# Patient Record
Sex: Female | Born: 1960 | Race: White | Hispanic: No | Marital: Married | Smoking: Never smoker
Health system: Southern US, Community
[De-identification: ages and names within clinical notes are randomized; demographics above are authoritative.]

## PROBLEM LIST (undated history)

## (undated) DIAGNOSIS — F329 Major depressive disorder, single episode, unspecified: Secondary | ICD-10-CM

## (undated) DIAGNOSIS — E079 Disorder of thyroid, unspecified: Secondary | ICD-10-CM

## (undated) DIAGNOSIS — T7840XA Allergy, unspecified, initial encounter: Secondary | ICD-10-CM

## (undated) DIAGNOSIS — F32A Depression, unspecified: Secondary | ICD-10-CM

## (undated) HISTORY — PX: ABDOMINAL HYSTERECTOMY: SHX81

## (undated) HISTORY — PX: LUNG SURGERY: SHX703

## (undated) HISTORY — PX: THYROID SURGERY: SHX805

## (undated) HISTORY — DX: Allergy, unspecified, initial encounter: T78.40XA

---

## 2013-09-21 ENCOUNTER — Emergency Department (HOSPITAL_BASED_OUTPATIENT_CLINIC_OR_DEPARTMENT_OTHER)
Admission: EM | Admit: 2013-09-21 | Discharge: 2013-09-21 | Disposition: A | Discharge status: Home / Self Care | Payer: Worker's Compensation | Attending: Emergency Medicine | Admitting: Emergency Medicine

## 2013-09-21 ENCOUNTER — Encounter (HOSPITAL_BASED_OUTPATIENT_CLINIC_OR_DEPARTMENT_OTHER): Payer: Self-pay | Admitting: Emergency Medicine

## 2013-09-21 ENCOUNTER — Emergency Department (HOSPITAL_BASED_OUTPATIENT_CLINIC_OR_DEPARTMENT_OTHER): Payer: Worker's Compensation

## 2013-09-21 DIAGNOSIS — W010XXA Fall on same level from slipping, tripping and stumbling without subsequent striking against object, initial encounter: Secondary | ICD-10-CM | POA: Insufficient documentation

## 2013-09-21 DIAGNOSIS — S93609A Unspecified sprain of unspecified foot, initial encounter: Secondary | ICD-10-CM

## 2013-09-21 DIAGNOSIS — Y9389 Activity, other specified: Secondary | ICD-10-CM | POA: Insufficient documentation

## 2013-09-21 DIAGNOSIS — Y929 Unspecified place or not applicable: Secondary | ICD-10-CM | POA: Insufficient documentation

## 2013-09-21 DIAGNOSIS — Z79899 Other long term (current) drug therapy: Secondary | ICD-10-CM | POA: Insufficient documentation

## 2013-09-21 DIAGNOSIS — E079 Disorder of thyroid, unspecified: Secondary | ICD-10-CM | POA: Insufficient documentation

## 2013-09-21 DIAGNOSIS — Z8659 Personal history of other mental and behavioral disorders: Secondary | ICD-10-CM | POA: Insufficient documentation

## 2013-09-21 DIAGNOSIS — IMO0002 Reserved for concepts with insufficient information to code with codable children: Secondary | ICD-10-CM | POA: Insufficient documentation

## 2013-09-21 DIAGNOSIS — Z791 Long term (current) use of non-steroidal anti-inflammatories (NSAID): Secondary | ICD-10-CM | POA: Insufficient documentation

## 2013-09-21 HISTORY — DX: Major depressive disorder, single episode, unspecified: F32.9

## 2013-09-21 HISTORY — DX: Depression, unspecified: F32.A

## 2013-09-21 HISTORY — DX: Disorder of thyroid, unspecified: E07.9

## 2013-09-21 MED ORDER — HYDROCODONE-ACETAMINOPHEN 5-325 MG PO TABS: 2.0000 | ORAL_TABLET | ORAL | Status: DC | PRN

## 2013-09-21 MED ORDER — MORPHINE SULFATE 4 MG/ML IJ SOLN
4.0000 mg | Freq: Once | INTRAMUSCULAR | Status: AC
  Administered 2013-09-21: 4 mg via INTRAVENOUS
  Filled 2013-09-21: qty 1

## 2013-09-21 MED ORDER — ONDANSETRON HCL 4 MG/2ML IJ SOLN
4.0000 mg | Freq: Once | INTRAMUSCULAR | Status: AC
  Administered 2013-09-21: 4 mg via INTRAVENOUS
  Filled 2013-09-21: qty 2

## 2013-09-21 NOTE — ED Notes (Signed)
Patient transported to x-ray. ?

## 2013-09-21 NOTE — Discharge Instructions (Signed)
Foot Sprain The muscles and cord like structures which attach muscle to bone (tendons) that surround the feet are made up of units. A foot sprain can occur at the weakest spot in any of these units. This condition is most often caused by injury to or overuse of the foot, as from playing contact sports, or aggravating a previous injury, or from poor conditioning, or obesity. SYMPTOMS  Pain with movement of the foot.  Tenderness and swelling at the injury site.  Loss of strength is present in moderate or severe sprains. THE THREE GRADES OR SEVERITY OF FOOT SPRAIN ARE:  Mild (Grade I): Slightly pulled muscle without tearing of muscle or tendon fibers or loss of strength.  Moderate (Grade II): Tearing of fibers in a muscle, tendon, or at the attachment to bone, with small decrease in strength.  Severe (Grade III): Rupture of the muscle-tendon-bone attachment, with separation of fibers. Severe sprain requires surgical repair. Often repeating (chronic) sprains are caused by overuse. Sudden (acute) sprains are caused by direct injury or over-use. DIAGNOSIS  Diagnosis of this condition is usually by your own observation. If problems continue, a caregiver may be required for further evaluation and treatment. X-rays may be required to make sure there are not breaks in the bones (fractures) present. Continued problems may require physical therapy for treatment. PREVENTION  Use strength and conditioning exercises appropriate for your sport.  Warm up properly prior to working out.  Use athletic shoes that are made for the sport you are participating in.  Allow adequate time for healing. Early return to activities makes repeat injury more likely, and can lead to an unstable arthritic foot that can result in prolonged disability. Mild sprains generally heal in 3 to 10 days, with moderate and severe sprains taking 2 to 10 weeks. Your caregiver can help you determine the proper time required for  healing. HOME CARE INSTRUCTIONS   Apply ice to the injury for 15-20 minutes, 03-04 times per day. Put the ice in a plastic bag and place a towel between the bag of ice and your skin.  An elastic wrap (like an Ace bandage) may be used to keep swelling down.  Keep foot above the level of the heart, or at least raised on a footstool, when swelling and pain are present.  Try to avoid use other than gentle range of motion while the foot is painful. Do not resume use until instructed by your caregiver. Then begin use gradually, not increasing use to the point of pain. If pain does develop, decrease use and continue the above measures, gradually increasing activities that do not cause discomfort, until you gradually achieve normal use.  Use crutches if and as instructed, and for the length of time instructed.  Keep injured foot and ankle wrapped between treatments.  Massage foot and ankle for comfort and to keep swelling down. Massage from the toes up towards the knee.  Only take over-the-counter or prescription medicines for pain, discomfort, or fever as directed by your caregiver. SEEK IMMEDIATE MEDICAL CARE IF:   Your pain and swelling increase, or pain is not controlled with medications.  You have loss of feeling in your foot or your foot turns cold or blue.  You develop new, unexplained symptoms, or an increase of the symptoms that brought you to your caregiver. MAKE SURE YOU:   Understand these instructions.  Will watch your condition.  Will get help right away if you are not doing well or get worse. Document Released:   09/13/2001 Document Revised: 06/16/2011 Document Reviewed: 11/11/2007 Exit[employer] Patient Information 2015 [employer]. This information is not intended to replace advice given to you by your health care provider. Make sure you discuss any questions you have with your health care provider.  

## 2013-09-21 NOTE — ED Provider Notes (Signed)
CSN: [id number]     Arrival date & time 09/21/13  1025 History   None    Chief Complaint  Patient presents with  . Ankle Injury     (Consider location/radiation/quality/duration/timing/severity/associated sxs/prior Treatment) HPI Comments: Patient presents to the ER for evaluation after a fall. Patient reports that she tripped over a carpet and fell to the ground. She reports that her knee and foot bent up under her when she fell. She is complaining of pain in the foot and ankle region, left knee as well as lower back, more on left side and midline. She did not hit her head or lose consciousness. No upper extremity injury. The pain is moderate to severe, constant and worsens with movement.  Patient is a 53 y.o. female presenting with lower extremity injury.  Ankle Injury Pertinent negatives include no headaches.    Past Medical History  Diagnosis Date  . Thyroid disease   . Depression    Past Surgical History  Procedure Laterality Date  . Lung surgery     No family history on file. History  Substance Use Topics  . Smoking status: Not on file  . Smokeless tobacco: Not on file  . Alcohol Use: Not on file   OB History   Grav Para Term Preterm Abortions TAB SAB Ect Mult Living                 Review of Systems  Musculoskeletal: Positive for arthralgias and back pain.  Neurological: Negative for dizziness, syncope and headaches.  All other systems reviewed and are negative.     Allergies  Tramadol  Home Medications   Prior to Admission medications   Medication Sig Start Date End Date Taking? Authorizing Provider  diclofenac (CATAFLAM) 50 MG tablet Take 50 mg by mouth 3 (three) times daily.   Yes Historical Provider, MD  fluticasone (VERAMYST) 27.5 MCG/SPRAY nasal spray Place 2 sprays into the nose daily.   Yes Historical Provider, MD  levothyroxine (SYNTHROID, LEVOTHROID) 100 MCG tablet Take 100 mcg by mouth daily before breakfast.   Yes Historical Provider, MD    omeprazole (PRILOSEC) 40 MG capsule Take 40 mg by mouth daily.   Yes Historical Provider, MD  phentermine 37.5 MG capsule Take 37.5 mg by mouth every morning.   Yes Historical Provider, MD   BP 127/77  Pulse 61  Temp(Src) 98.1 F (36.7 C) (Oral)  Resp 16  Ht 5\' 5"  (1.651 m)  Wt 180 lb (81.647 kg)  BMI 29.95 kg/m2  SpO2 99% Physical Exam  Constitutional: She is oriented to person, place, and time. She appears well-developed and well-nourished. No distress.  HENT:  Head: Normocephalic and atraumatic.  Right Ear: Hearing normal.  Left Ear: Hearing normal.  Nose: Nose normal.  Mouth/Throat: Oropharynx is clear and moist and mucous membranes are normal.  Eyes: Conjunctivae and EOM are normal. Pupils are equal, round, and reactive to light.  Neck: Normal range of motion. Neck supple. No spinous process tenderness and no muscular tenderness present.  Cardiovascular: Regular rhythm, S1 normal and S2 normal.  Exam reveals no gallop and no friction rub.   No murmur heard. Pulmonary/Chest: Effort normal and breath sounds normal. No respiratory distress. She exhibits no tenderness.  Abdominal: Soft. Normal appearance and bowel sounds are normal. There is no hepatosplenomegaly. There is no tenderness. There is no rebound, no guarding, no tenderness at McBurney's point and negative Murphy's sign. No hernia.  Musculoskeletal: Normal range of motion.  Left knee: She exhibits normal range of motion, no swelling, no effusion, no ecchymosis and no deformity. Tenderness found.       Left ankle: No lateral malleolus and no medial malleolus tenderness found.       Thoracic back: Normal.       Lumbar back: She exhibits tenderness. She exhibits no bony tenderness.       Back:       Left foot: She exhibits bony tenderness.       Feet:  Neurological: She is alert and oriented to person, place, and time. She has normal strength. No cranial nerve deficit or sensory deficit. Coordination normal. GCS  eye subscore is 4. GCS verbal subscore is 5. GCS motor subscore is 6.  Skin: Skin is warm, dry and intact. No rash noted. No cyanosis.  Psychiatric: She has a normal mood and affect. Her speech is normal and behavior is normal. Thought content normal.    ED Course  Procedures (including critical care time) Labs Review Labs Reviewed - No data to display  Imaging Review Dg Lumbar Spine Complete  09/21/2013   CLINICAL DATA:  Fall, back pain  EXAM: LUMBAR SPINE - COMPLETE 4+ VIEW  COMPARISON:  None.  FINDINGS: There is levoscoliosis of the lumbar spine. No acute loss vertebral body height or disc height. Endplate spurring is noted multiple levels. No subluxation.  IMPRESSION: 1. No acute findings lumbar spine. 2. Mild disc osteophytic disease and scoliosis.   Electronically Signed   By: Genevive BiStewart  Edmunds M.D.   On: 09/21/2013 11:28   Dg Ankle Complete Left  09/21/2013   CLINICAL DATA:  Tripped over carpet.  Left lower extremity pain.  EXAM: LEFT ANKLE COMPLETE - 3+ VIEW  COMPARISON:  None.  FINDINGS: There is no evidence of fracture, dislocation, or joint effusion. Small plantar calcaneal spur. Mild soft tissue swelling dorsal to the talar head.  IMPRESSION: 1. Mild dorsal soft tissue swelling along the ankle. No fracture identified. 2. Small plantar calcaneal spur.   Electronically Signed   By: Herbie BaltimoreWalt  Liebkemann M.D.   On: 09/21/2013 11:25   Dg Knee Complete 4 Views Left  09/21/2013   CLINICAL DATA:  Tripped over carpet.  Knee pain.  EXAM: LEFT KNEE - COMPLETE 4+ VIEW  COMPARISON:  None.  FINDINGS: Mild spurring of the tibial spine. Mild patellar spurring. No fracture or knee effusion. No acute bony findings.  IMPRESSION: 1. No acute bony findings or knee effusion. 2. Mild spurring of the patella and tibial spine.   Electronically Signed   By: Herbie BaltimoreWalt  Liebkemann M.D.   On: 09/21/2013 11:28   Dg Foot Complete Left  09/21/2013   CLINICAL DATA:  Pt fell over a piece of carpet today, pain lt foot, ankle  and knee / pain, low back with rt hip pain  EXAM: LEFT FOOT - COMPLETE 3+ VIEW  COMPARISON:  None.  FINDINGS: No fracture or dislocation of mid foot or forefoot. The phalanges are normal. The calcaneus is normal. There is soft tissue swelling over the talus anteriorly.  IMPRESSION: 1. No evidence of ankle fracture. 2. Soft tissue swelling over the anterior talus.   Electronically Signed   By: Genevive BiStewart  Edmunds M.D.   On: 09/21/2013 11:26     EKG Interpretation None      MDM   Final diagnoses:  None   ankle/foot sprain  Patient presents to the ER after injury of the left foot and ankle region. Patient tripped and fell with the  foot hyperextended and the knee flexed under her. She is complaining of pain in these areas as well as low back. Back examination was more consistent with soft tissue injury, off to the left side. Lumbar x-ray was negative. X-ray of the knee was negative, no concern for ligamentous instability at this time based on exam. She did have swelling and tenderness over the anterior talus region. Ankle, however, was not swollen. X-ray of foot and ankle were performed and is noted. Patient does have moderate swelling and pain and tenderness in the anterior dorsal foot region. Will be placed in a walking splint, followup with orthopedics.    Gilda Creasehristopher J. Shaquel Chavous, MD 09/21/13 1147

## 2013-09-21 NOTE — ED Notes (Signed)
Left ankle pain and deformity after tripping and falling on carpet.

## 2013-09-22 ENCOUNTER — Ambulatory Visit (INDEPENDENT_AMBULATORY_CARE_PROVIDER_SITE_OTHER): Payer: Worker's Compensation | Admitting: Family Medicine

## 2013-09-22 ENCOUNTER — Encounter: Payer: Self-pay | Admitting: Family Medicine

## 2013-09-22 ENCOUNTER — Ambulatory Visit: Payer: Worker's Compensation | Admitting: Family Medicine

## 2013-09-22 VITALS — BP 118/75 | HR 75 | Ht 65.0 in | Wt 180.0 lb

## 2013-09-22 DIAGNOSIS — S8000XA Contusion of unspecified knee, initial encounter: Secondary | ICD-10-CM

## 2013-09-22 DIAGNOSIS — S39012A Strain of muscle, fascia and tendon of lower back, initial encounter: Secondary | ICD-10-CM

## 2013-09-22 DIAGNOSIS — S99929A Unspecified injury of unspecified foot, initial encounter: Secondary | ICD-10-CM

## 2013-09-22 DIAGNOSIS — S8990XA Unspecified injury of unspecified lower leg, initial encounter: Secondary | ICD-10-CM

## 2013-09-22 DIAGNOSIS — S99919A Unspecified injury of unspecified ankle, initial encounter: Secondary | ICD-10-CM

## 2013-09-22 DIAGNOSIS — S335XXA Sprain of ligaments of lumbar spine, initial encounter: Secondary | ICD-10-CM

## 2013-09-22 DIAGNOSIS — S99912A Unspecified injury of left ankle, initial encounter: Secondary | ICD-10-CM

## 2013-09-22 MED ORDER — HYDROCODONE-ACETAMINOPHEN 5-325 MG PO TABS: 1.0000 | ORAL_TABLET | Freq: Four times a day (QID) | ORAL | Status: DC | PRN

## 2013-09-22 NOTE — Patient Instructions (Signed)
You have an ankle sprain. Ice the area for 15 minutes at a time, 3-4 times a day Aleve 2 tabs twice a day with food OR ibuprofen 3 tabs three times a day with food for pain and inflammation. Norco as needed for severe pain. Elevate above the level of your heart when possible Crutches if needed to help with walking Bear weight when tolerated Use boot to help with stability while you recover from this injury for next 2 weeks. Come out of the boot/brace twice a day to do Up/down and alphabet exercises 2-3 sets of each. Consider physical therapy for strengthening and balance exercises in the future.  You have a right knee contusion and a lumbar strain as well from the fall. We will reassess these in 2 weeks. Medication as noted above. Can try some home back exercises in the handout in the meantime. Will consider physical therapy for this depending on how you're doing. Follow up with me in 2 weeks.

## 2013-09-26 ENCOUNTER — Encounter: Payer: Self-pay | Admitting: Family Medicine

## 2013-09-26 DIAGNOSIS — S99912A Unspecified injury of left ankle, initial encounter: Secondary | ICD-10-CM | POA: Insufficient documentation

## 2013-09-26 DIAGNOSIS — S8000XA Contusion of unspecified knee, initial encounter: Secondary | ICD-10-CM | POA: Insufficient documentation

## 2013-09-26 DIAGNOSIS — M545 Low back pain, unspecified: Secondary | ICD-10-CM | POA: Insufficient documentation

## 2013-09-26 NOTE — Assessment & Plan Note (Signed)
radiographs negative, exam benign.  Reassured.

## 2013-09-26 NOTE — Assessment & Plan Note (Signed)
radiographs negative.  2/2 strain.  NSAIDs with norco as needed.  Consider PT in future.

## 2013-09-26 NOTE — Progress Notes (Signed)
Patient ID: Toniann KetJudy Vazquez, female   DOB: [birth year], 53 y.o.   MRN: [id number]  PCP: Joneen Roachhomas, GILLIAN W, NP  Subjective:   HPI: Patient is a 53 y.o. female here for left ankle injury.  Patient reports on 6/17 she was at work when she slipped on a piece of carpet padding, left ankle seemed to hyperdorsiflex and she fell backwards onto her buttocks. Went to ED - had x-rays negative for a fracture. Pain is lateral, radiates up outside of leg. Placed in a boot and taking hydrocodone as needed for pain. No prior ankle injuries.  Past Medical History  Diagnosis Date  . Thyroid disease   . Depression   . Allergy     Current Outpatient Prescriptions on File Prior to Visit  Medication Sig Dispense Refill  . fluticasone (VERAMYST) 27.5 MCG/SPRAY nasal spray Place 2 sprays into the nose daily.      Marland Kitchen. levothyroxine (SYNTHROID, LEVOTHROID) 100 MCG tablet Take 100 mcg by mouth daily before breakfast.      . omeprazole (PRILOSEC) 40 MG capsule Take 40 mg by mouth daily.      . phentermine 37.5 MG capsule Take 37.5 mg by mouth every morning.       No current facility-administered medications on file prior to visit.    Past Surgical History  Procedure Laterality Date  . Lung surgery    . Thyroid surgery    . Abdominal hysterectomy      Allergies  Allergen Reactions  . Tramadol     History   Social History  . Marital Status: Married    Spouse Name: N/A    Number of Children: N/A  . Years of Education: N/A   Occupational History  . Not on file.   Social History Main Topics  . Smoking status: Never Smoker   . Smokeless tobacco: Not on file  . Alcohol Use: Not on file  . Drug Use: Not on file  . Sexual Activity: Not on file   Other Topics Concern  . Not on file   Social History Narrative  . No narrative on file    No family history on file.  BP 118/75  Pulse 75  Ht 5\' 5"  (1.651 m)  Wt 180 lb (81.647 kg)  BMI 29.95 kg/m2  Review of Systems: See HPI above.     Objective:  Physical Exam:  Gen: NAD  Left ankle: Mod swelling, bruising lateral ankle.  No other deformity. Mod limitation ROM all directions. TTP Greatest ATFL, less lateral malleolus.  No other bony tenderness of ankle/foot. 2+ ant drawer and talar tilt.  Painful tilt. Negative syndesmotic compression. Thompsons test negative. NV intact distally.    Left knee: No gross deformity, ecchymoses, swelling. TTP mildly over patella, joint lines.  No other TTP. FROM. Negative ant/post drawers. Negative valgus/varus testing. Negative lachmanns. Negative mcmurrays, apleys, patellar apprehension. NV intact distally.  Back: No gross deformity, scoliosis. TTP bilateral lumbar paraspinal regions.  No midline or bony TTP. FROM. Strength LEs 5/5 all muscle groups.   2+ MSRs in patellar and achilles tendons, equal bilaterally. Negative SLRs. Sensation intact to light touch bilaterally. Negative logroll bilateral hips  Assessment & Plan:  1. Left ankle injury - 2/2 sprain.  Shown HEP to do daily.  NSAIDs with norco as needed.  Cam walker for 2 weeks total - f/u at that time.  Hopefully switch to ASO, start physical therapy or home strengthening exercises.  Elevation, icing.    2. Low back injury -  radiographs negative.  2/2 strain.  NSAIDs with norco as needed.  Consider PT in future.  3. Knee contusion - radiographs negative, exam benign.  Reassured.

## 2013-09-26 NOTE — Assessment & Plan Note (Signed)
2/2 sprain.  Shown HEP to do daily.  NSAIDs with norco as needed.  Cam walker for 2 weeks total - f/u at that time.  Hopefully switch to ASO, start physical therapy or home strengthening exercises.  Elevation, icing.

## 2013-10-06 ENCOUNTER — Encounter: Payer: Self-pay | Admitting: Family Medicine

## 2013-10-06 ENCOUNTER — Ambulatory Visit (INDEPENDENT_AMBULATORY_CARE_PROVIDER_SITE_OTHER): Payer: Worker's Compensation | Admitting: Family Medicine

## 2013-10-06 VITALS — BP 126/85 | HR 78 | Ht 65.0 in | Wt 175.0 lb

## 2013-10-06 DIAGNOSIS — S99919A Unspecified injury of unspecified ankle, initial encounter: Secondary | ICD-10-CM

## 2013-10-06 DIAGNOSIS — Z5189 Encounter for other specified aftercare: Secondary | ICD-10-CM

## 2013-10-06 DIAGNOSIS — S99912D Unspecified injury of left ankle, subsequent encounter: Secondary | ICD-10-CM

## 2013-10-06 DIAGNOSIS — S8990XA Unspecified injury of unspecified lower leg, initial encounter: Secondary | ICD-10-CM

## 2013-10-06 DIAGNOSIS — S99929A Unspecified injury of unspecified foot, initial encounter: Secondary | ICD-10-CM

## 2013-10-06 NOTE — Patient Instructions (Signed)
You have an ankle sprain. Start physical therapy - we will arrange this through workers comp. Ice the area for 15 minutes at a time, 3-4 times a day Aleve 2 tabs twice a day with food OR ibuprofen 3 tabs three times a day with food for pain and inflammation. Norco as needed for severe pain. Elevate above the level of your heart when possible Use ankle brace or boot when walking long distances. Use ace wrap for compression under boot. Come out of the boot/brace twice a day to do Up/down and alphabet exercises 2-3 sets of each. Out of work for 4 weeks then follow-up at that time.

## 2013-10-10 ENCOUNTER — Encounter: Payer: Self-pay | Admitting: Family Medicine

## 2013-10-10 NOTE — Progress Notes (Signed)
Patient ID: Mary Meadows, female   DOB: 01/06/1961, 53 y.o.   MRN: [id number]  PCP: Joneen Roachhomas, GILLIAN W, NP  Subjective:   HPI: Patient is a 53 y.o. female here for left ankle injury.  6/18: Patient reports on 6/17 she was at work when she slipped on a piece of carpet padding, left ankle seemed to hyperdorsiflex and she fell backwards onto her buttocks. Went to ED - had x-rays negative for a fracture. Pain is lateral, radiates up outside of leg. Placed in a boot and taking hydrocodone as needed for pain. No prior ankle injuries.  7/2: Patient reports she is starting to improve.   Aching at times. Icing, takes norco as needed. [facility]. Using boot, doing HEP.  Past Medical History  Diagnosis Date  . Thyroid disease   . Depression   . Allergy     Current Outpatient Prescriptions on File Prior to Visit  Medication Sig Dispense Refill  . diclofenac (VOLTAREN) 75 MG EC tablet       . fluticasone (VERAMYST) 27.5 MCG/SPRAY nasal spray Place 2 sprays into the nose daily.      Marland Kitchen. gabapentin (NEURONTIN) 300 MG capsule       . HYDROcodone-acetaminophen (NORCO/VICODIN) 5-325 MG per tablet Take 1 tablet by mouth every 6 (six) hours as needed for moderate pain.  60 tablet  0  . levothyroxine (SYNTHROID, LEVOTHROID) 100 MCG tablet Take 100 mcg by mouth daily before breakfast.      . omeprazole (PRILOSEC) 40 MG capsule Take 40 mg by mouth daily.      . phentermine 37.5 MG capsule Take 37.5 mg by mouth every morning.       No current facility-administered medications on file prior to visit.    Past Surgical History  Procedure Laterality Date  . Lung surgery    . Thyroid surgery    . Abdominal hysterectomy      Allergies  Allergen Reactions  . Tramadol     History   Social History  . Marital Status: Married    Spouse Name: N/A    Number of Children: N/A  . Years of Education: N/A   Occupational History  . Not on file.   Social History Main Topics  .  Smoking status: Never Smoker   . Smokeless tobacco: Not on file  . Alcohol Use: Not on file  . Drug Use: Not on file  . Sexual Activity: Not on file   Other Topics Concern  . Not on file   Social History Narrative  . No narrative on file    No family history on file.  BP 126/85  Pulse 78  Ht 5\' 5"  (1.651 m)  Wt 175 lb (79.379 kg)  BMI 29.12 kg/m2  Review of Systems: See HPI above.    Objective:  Physical Exam:  Gen: NAD  Left ankle: Mild swelling, minimal bruising lateral ankle.  No other deformity.  Hematoma about 1.5cm in diameter dorsal foot. Mild limitation ROM all directions. TTP greatest ATFL, less lateral malleolus.  No other bony tenderness of ankle/foot. 1+ ant drawer and talar tilt.  Painful tilt. Negative syndesmotic compression. Thompsons test negative. NV intact distally.    Assessment & Plan:  1. Left ankle injury - 2/2 sprain.  Improving.  Start physical therapy.  NSAIDs with norco as needed.  Cam walker only if needed - try to switch to ASO if tolerated.  for 2 weeks total - f/u at that time.  Hopefully switch to ASO, start physical therapy or home strengthening exercises.  Elevation, icing.  F/u in 4 weeks.  Continue out of work.

## 2013-10-10 NOTE — Assessment & Plan Note (Signed)
2/2 sprain.  Improving.  Start physical therapy.  NSAIDs with norco as needed.  Cam walker only if needed - try to switch to ASO if tolerated.  for 2 weeks total - f/u at that time.  Hopefully switch to ASO, start physical therapy or home strengthening exercises.  Elevation, icing.  F/u in 4 weeks.  Continue out of work.

## 2013-10-17 ENCOUNTER — Other Ambulatory Visit: Payer: Self-pay | Admitting: *Deleted

## 2013-10-17 DIAGNOSIS — S99912A Unspecified injury of left ankle, initial encounter: Secondary | ICD-10-CM

## 2013-11-03 ENCOUNTER — Ambulatory Visit (INDEPENDENT_AMBULATORY_CARE_PROVIDER_SITE_OTHER): Payer: Worker's Compensation | Admitting: Family Medicine

## 2013-11-03 ENCOUNTER — Encounter: Payer: Self-pay | Admitting: Family Medicine

## 2013-11-03 VITALS — BP 116/73 | HR 87 | Ht 65.0 in | Wt 175.0 lb

## 2013-11-03 DIAGNOSIS — S99912D Unspecified injury of left ankle, subsequent encounter: Secondary | ICD-10-CM

## 2013-11-03 DIAGNOSIS — S99919A Unspecified injury of unspecified ankle, initial encounter: Secondary | ICD-10-CM

## 2013-11-03 DIAGNOSIS — S8990XA Unspecified injury of unspecified lower leg, initial encounter: Secondary | ICD-10-CM

## 2013-11-03 DIAGNOSIS — Z5189 Encounter for other specified aftercare: Secondary | ICD-10-CM

## 2013-11-03 DIAGNOSIS — S99929A Unspecified injury of unspecified foot, initial encounter: Secondary | ICD-10-CM

## 2013-11-07 ENCOUNTER — Encounter: Payer: Self-pay | Admitting: Family Medicine

## 2013-11-07 NOTE — Progress Notes (Signed)
Patient ID: Mary Meadows, female   DOB: 10-Dec-1960, 53 y.o.   MRN: [id number]  PCP: Joneen Roachhomas, GILLIAN W, NP  Subjective:   HPI: Patient is a 53 y.o. female here for left ankle injury.  6/18: Patient reports on 6/17 she was at work when she slipped on a piece of carpet padding, left ankle seemed to hyperdorsiflex and she fell backwards onto her buttocks. Went to ED - had x-rays negative for a fracture. Pain is lateral, radiates up outside of leg. Placed in a boot and taking hydrocodone as needed for pain. No prior ankle injuries.  7/2: Patient reports she is starting to improve.   Aching at times. Icing, takes norco as needed. [facility] if needed too. Using boot, doing HEP.  7/30: Patient reports she is about 50% better from last visit. Only started PT this week - first visit. Hematoma of foot is shrinking. Using ASO. Walking better. Taking pain medicine at night, ibuprofen during day.  Past Medical History  Diagnosis Date  . Thyroid disease   . Depression   . Allergy     Current Outpatient Prescriptions on File Prior to Visit  Medication Sig Dispense Refill  . diclofenac (VOLTAREN) 75 MG EC tablet       . fluticasone (VERAMYST) 27.5 MCG/SPRAY nasal spray Place 2 sprays into the nose daily.      Marland Kitchen. gabapentin (NEURONTIN) 300 MG capsule       . HYDROcodone-acetaminophen (NORCO/VICODIN) 5-325 MG per tablet Take 1 tablet by mouth every 6 (six) hours as needed for moderate pain.  60 tablet  0  . levothyroxine (SYNTHROID, LEVOTHROID) 100 MCG tablet Take 100 mcg by mouth daily before breakfast.      . omeprazole (PRILOSEC) 40 MG capsule Take 40 mg by mouth daily.      . phentermine 37.5 MG capsule Take 37.5 mg by mouth every morning.       No current facility-administered medications on file prior to visit.    Past Surgical History  Procedure Laterality Date  . Lung surgery    . Thyroid surgery    . Abdominal hysterectomy      Allergies  Allergen Reactions  .  Tramadol     History   Social History  . Marital Status: Married    Spouse Name: N/A    Number of Children: N/A  . Years of Education: N/A   Occupational History  . Not on file.   Social History Main Topics  . Smoking status: Never Smoker   . Smokeless tobacco: Not on file  . Alcohol Use: Not on file  . Drug Use: Not on file  . Sexual Activity: Not on file   Other Topics Concern  . Not on file   Social History Narrative  . No narrative on file    No family history on file.  BP 116/73  Pulse 87  Ht 5\' 5"  (1.651 m)  Wt 175 lb (79.379 kg)  BMI 29.12 kg/m2  Review of Systems: See HPI above.    Objective:  Physical Exam:  Gen: NAD  Left ankle: Mild swelling, minimal bruising lateral ankle.  No other deformity.  Hematoma about 1.5cm in diameter dorsal foot is smaller, does not protrude as much, less tender. Mild limitation ROM all directions. TTP greatest ATFL, less lateral malleolus.  No other bony tenderness of ankle/foot. 1+ ant drawer and talar tilt.  Negative syndesmotic compression. Thompsons test negative. NV intact distally.    Assessment & Plan:  1. Left ankle injury - 2/2 sprain.  Improving.  Continue physical therapy (only been to 1 visit so far).  NSAIDs with norco as needed.  ASO for stability.  Elevation, icing as needed.  F/u in 1 month.  Written for light duty if available (see letter).

## 2013-11-07 NOTE — Assessment & Plan Note (Signed)
2/2 sprain.  Improving.  Continue physical therapy (only been to 1 visit so far).  NSAIDs with norco as needed.  ASO for stability.  Elevation, icing as needed.  F/u in 1 month.  Written for light duty if available (see letter).

## 2013-12-05 ENCOUNTER — Encounter: Payer: Self-pay | Admitting: Family Medicine

## 2013-12-05 ENCOUNTER — Ambulatory Visit (INDEPENDENT_AMBULATORY_CARE_PROVIDER_SITE_OTHER): Payer: Worker's Compensation | Admitting: Family Medicine

## 2013-12-05 VITALS — BP 115/77 | HR 91 | Ht 65.0 in | Wt 170.0 lb

## 2013-12-05 DIAGNOSIS — S99929A Unspecified injury of unspecified foot, initial encounter: Secondary | ICD-10-CM

## 2013-12-05 DIAGNOSIS — S99919A Unspecified injury of unspecified ankle, initial encounter: Secondary | ICD-10-CM

## 2013-12-05 DIAGNOSIS — Z5189 Encounter for other specified aftercare: Secondary | ICD-10-CM

## 2013-12-05 DIAGNOSIS — S335XXA Sprain of ligaments of lumbar spine, initial encounter: Secondary | ICD-10-CM

## 2013-12-05 DIAGNOSIS — S8990XA Unspecified injury of unspecified lower leg, initial encounter: Secondary | ICD-10-CM

## 2013-12-05 DIAGNOSIS — S99912D Unspecified injury of left ankle, subsequent encounter: Secondary | ICD-10-CM

## 2013-12-05 DIAGNOSIS — S39012D Strain of muscle, fascia and tendon of lower back, subsequent encounter: Secondary | ICD-10-CM

## 2013-12-05 MED ORDER — HYDROCODONE-ACETAMINOPHEN 5-325 MG PO TABS: 1.0000 | ORAL_TABLET | Freq: Every evening | ORAL | Status: DC | PRN

## 2013-12-05 NOTE — Patient Instructions (Signed)
Continue physical therapy for your ankle but we will also add it for your back. Heat may be better for the back - they tend to help with spasms. Icing as needed for the ankle. Aleve 2 tabs twice a day with food OR ibuprofen 3 tabs three times a day with food for pain and inflammation. Norco as needed for severe pain at nighttime. Use ankle brace only as needed. Start light duty at work at this point. Follow up with me in 5-6 weeks.

## 2013-12-05 NOTE — Progress Notes (Signed)
Patient ID: Mary Meadows, female   DOB: 1960/08/17, 53 y.o.   MRN: [id number]  PCP: Joneen Roach, NP  Subjective:   HPI: Patient is a 53 y.o. female here for left ankle injury.  6/18: Patient reports on 6/17 she was at work when she slipped on a piece of carpet padding, left ankle seemed to hyperdorsiflex and she fell backwards onto her buttocks. Went to ED - had x-rays negative for a fracture. Pain is lateral, radiates up outside of leg. Placed in a boot and taking hydrocodone as needed for pain. No prior ankle injuries.  7/2: Patient reports she is starting to improve.   Aching at times. Icing, takes norco as needed. Using [facility] powders if needed too. Using boot, doing HEP.  7/30: Patient reports she is about 50% better from last visit. Only started PT this week - first visit. Hematoma of foot is shrinking. Using ASO. Walking better. Taking pain medicine at night, ibuprofen during day.  8/31: Patient reports ankle is now 80-85% improved. Doing physical therapy for this. Takes hydrocodone if needed but more for her back. Had injury to this as well but has worsened over past few weeks. Mostly on left side with some radiation of numbness to left bottom of foot. No bowel/bladder dysfunction.  Past Medical History  Diagnosis Date  . Thyroid disease   . Depression   . Allergy     Current Outpatient Prescriptions on File Prior to Visit  Medication Sig Dispense Refill  . diclofenac (VOLTAREN) 75 MG EC tablet       . fluticasone (VERAMYST) 27.5 MCG/SPRAY nasal spray Place 2 sprays into the nose daily.      Marland Kitchen gabapentin (NEURONTIN) 300 MG capsule       . levothyroxine (SYNTHROID, LEVOTHROID) 100 MCG tablet Take 100 mcg by mouth daily before breakfast.      . omeprazole (PRILOSEC) 40 MG capsule Take 40 mg by mouth daily.      . phentermine 37.5 MG capsule Take 37.5 mg by mouth every morning.       No current facility-administered medications on file prior to visit.     Past Surgical History  Procedure Laterality Date  . Lung surgery    . Thyroid surgery    . Abdominal hysterectomy      Allergies  Allergen Reactions  . Tramadol     History   Social History  . Marital Status: Married    Spouse Name: N/A    Number of Children: N/A  . Years of Education: N/A   Occupational History  . Not on file.   Social History Main Topics  . Smoking status: Never Smoker   . Smokeless tobacco: Not on file  . Alcohol Use: Not on file  . Drug Use: Not on file  . Sexual Activity: Not on file   Other Topics Concern  . Not on file   Social History Narrative  . No narrative on file    No family history on file.  BP 115/77  Pulse 91  Ht  (1.651 m)  Wt 170 lb (77.111 kg)  BMI 28.29 kg/m2  Review of Systems: See HPI above.    Objective:  Physical Exam:  Gen: NAD  Left ankle: Mild swelling, minimal bruising lateral ankle.  No other deformity.  Hematoma about 1.5cm in diameter dorsal foot is smaller, does not protrude as much, less tender. Mild limitation ROM all directions. TTP greatest ATFL, less lateral malleolus.  No other bony  tenderness of ankle/foot. 1+ ant drawer and talar tilt.  Negative syndesmotic compression. Thompsons test negative. NV intact distally.  Back: No gross deformity, scoliosis. TTP left lumbar paraspinal region.  No midline or bony TTP. Strength LEs 5/5 all muscle groups.   2+ MSRs in patellar and achilles tendons, equal bilaterally. Negative SLRs. Sensation intact to light touch bilaterally. Negative logroll bilateral hips    Assessment & Plan:  1. Left ankle injury - 2/2 sprain.  Continues to improve.  Continue physical therapy (only been to 1 visit so far).  NSAIDs with norco as needed.  ASO as needed for stability.  Elevation, icing as needed.  F/u in 5-6 weeks.    2. Lumbar strain - will add physical therapy for this.  She did sustain injury at work to her back (see initial note) but primary issue  has been ankle which is much improved.  Heat as needed.  Follow-up in 5-6 weeks.

## 2013-12-05 NOTE — Assessment & Plan Note (Signed)
will add physical therapy for this.  She did sustain injury at work to her back (see initial note) but primary issue has been ankle which is much improved.  Heat as needed.  Follow-up in 5-6 weeks.

## 2013-12-05 NOTE — Assessment & Plan Note (Signed)
2/2 sprain.  Continues to improve.  Continue physical therapy (only been to 1 visit so far).  NSAIDs with norco as needed.  ASO as needed for stability.  Elevation, icing as needed.  F/u in 5-6 weeks.

## 2013-12-15 ENCOUNTER — Encounter: Payer: Self-pay | Admitting: Family Medicine

## 2013-12-29 ENCOUNTER — Encounter: Payer: Self-pay | Admitting: Family Medicine

## 2013-12-29 ENCOUNTER — Ambulatory Visit (INDEPENDENT_AMBULATORY_CARE_PROVIDER_SITE_OTHER): Payer: Worker's Compensation | Admitting: Family Medicine

## 2013-12-29 VITALS — BP 115/78 | HR 87 | Ht 65.0 in | Wt 170.0 lb

## 2013-12-29 DIAGNOSIS — S39012D Strain of muscle, fascia and tendon of lower back, subsequent encounter: Secondary | ICD-10-CM

## 2013-12-29 DIAGNOSIS — S335XXA Sprain of ligaments of lumbar spine, initial encounter: Secondary | ICD-10-CM

## 2013-12-29 DIAGNOSIS — Z5189 Encounter for other specified aftercare: Secondary | ICD-10-CM

## 2013-12-29 DIAGNOSIS — S99912D Unspecified injury of left ankle, subsequent encounter: Secondary | ICD-10-CM

## 2013-12-29 DIAGNOSIS — S99929A Unspecified injury of unspecified foot, initial encounter: Secondary | ICD-10-CM

## 2013-12-29 DIAGNOSIS — S8990XA Unspecified injury of unspecified lower leg, initial encounter: Secondary | ICD-10-CM

## 2013-12-29 DIAGNOSIS — S99919A Unspecified injury of unspecified ankle, initial encounter: Secondary | ICD-10-CM

## 2013-12-29 MED ORDER — PREDNISONE (PAK) 10 MG PO TABS: ORAL_TABLET | ORAL | Status: DC

## 2013-12-29 NOTE — Patient Instructions (Signed)
We will go ahead with an MRI of your ankle to assess for an osteochondral defect. Call me if you haven't heard from me a couple days after you get this. Go ahead with the physical therapy for your back when it's approved. Take the prednisone for your back as well. Day after finishing this it's ok to take aleve or ibuprofen. Continue with brace, home exercises for your ankle in the meantime.

## 2013-12-30 ENCOUNTER — Encounter: Payer: Self-pay | Admitting: Family Medicine

## 2013-12-30 NOTE — Progress Notes (Signed)
Patient ID: Mary Meadows, female   DOB: 09-18-1960, 53 y.o.   MRN: [id number]  PCP: Joneen Roach, NP  Subjective:   HPI: Patient is a 53 y.o. female here for left ankle injury, back injury.  6/18: Patient reports on 6/17 she was at work when she slipped on a piece of carpet padding, left ankle seemed to hyperdorsiflex and she fell backwards onto her buttocks. Went to ED - had x-rays negative for a fracture. Pain is lateral, radiates up outside of leg. Placed in a boot and taking hydrocodone as needed for pain. No prior ankle injuries.  7/2: Patient reports she is starting to improve.   Aching at times. Icing, takes norco as needed. Using [facility] powders if needed too. Using boot, doing HEP.  7/30: Patient reports she is about 50% better from last visit. Only started PT this week - first visit. Hematoma of foot is shrinking. Using ASO. Walking better. Taking pain medicine at night, ibuprofen during day.  8/31: Patient reports ankle is now 80-85% improved. Doing physical therapy for this. Takes hydrocodone if needed but more for her back. Had injury to this as well but has worsened over past few weeks. Mostly on left side with some radiation of numbness to left bottom of foot. No bowel/bladder dysfunction.  9/24: Patient's improvement has plateaued unfortunately. Still doing physical therapy - just had last visit of this. Doing home exercises too. Has hydrocodone as needed. Has not heard about physical therapy for her back yet - no changes here Radiates into left foot with numbness. No bowel/bladder dysfunction. Friday she felt like something pulled or popped on dorsal aspect of her left foot/ankle. Started using the brace again. Had swelling with this going into arch.  Past Medical History  Diagnosis Date  . Thyroid disease   . Depression   . Allergy     Current Outpatient Prescriptions on File Prior to Visit  Medication Sig Dispense Refill  . diclofenac  (VOLTAREN) 75 MG EC tablet       . fluticasone (VERAMYST) 27.5 MCG/SPRAY nasal spray Place 2 sprays into the nose daily.      Marland Kitchen gabapentin (NEURONTIN) 300 MG capsule       . HYDROcodone-acetaminophen (NORCO/VICODIN) 5-325 MG per tablet Take 1 tablet by mouth at bedtime as needed for moderate pain.  30 tablet  0  . levothyroxine (SYNTHROID, LEVOTHROID) 100 MCG tablet Take 100 mcg by mouth daily before breakfast.      . omeprazole (PRILOSEC) 40 MG capsule Take 40 mg by mouth daily.      . phentermine 37.5 MG capsule Take 37.5 mg by mouth every morning.       No current facility-administered medications on file prior to visit.    Past Surgical History  Procedure Laterality Date  . Lung surgery    . Thyroid surgery    . Abdominal hysterectomy      Allergies  Allergen Reactions  . Tramadol     History   Social History  . Marital Status: Married    Spouse Name: N/A    Number of Children: N/A  . Years of Education: N/A   Occupational History  . Not on file.   Social History Main Topics  . Smoking status: Never Smoker   . Smokeless tobacco: Not on file  . Alcohol Use: Not on file  . Drug Use: Not on file  . Sexual Activity: Not on file   Other Topics Concern  . Not on file  Social History Narrative  . No narrative on file    No family history on file.  BP 115/78  Pulse 87  Ht  (1.651 m)  Wt 170 lb (77.111 kg)  BMI 28.29 kg/m2  Review of Systems: See HPI above.    Objective:  Physical Exam:  Gen: NAD  Left ankle: No swelling, bruising, other deformity. FROM with pain on dorsiflexion mildly. TTP greatest ATFL, less lateral malleolus.  No other bony tenderness of ankle/foot. 1+ ant drawer and talar tilt.  Negative syndesmotic compression. Thompsons test negative. NV intact distally.  Back: No gross deformity, scoliosis. TTP left lumbar paraspinal region.  No midline or bony TTP. Negative SLRs. Sensation intact to light touch  bilaterally. Negative logroll bilateral hips    Assessment & Plan:  1. Left ankle injury - Unfortunately her improvement has plateaued and still dealing with a constant 2/10 pain at baseline despite extensive PT and home exercises, ASO.  At this point advised we should go ahead with MRI to assess for osteochondral defect which could require surgery and explain why she hasn't improved completely to this point.  Continue HEP, NSAIDs with hydrocodone prn, ASO.    2. Lumbar strain - Possible disc herniation, radiculopathy.  Needs to do physical therapy - expect this will improve with therapy.  Heat prn.  Trial prednisone dose pack then return to nsaids with hydrocodone prn.

## 2013-12-30 NOTE — Assessment & Plan Note (Signed)
Possible disc herniation, radiculopathy.  Needs to do physical therapy - expect this will improve with therapy.  Heat prn.  Trial prednisone dose pack then return to nsaids with hydrocodone prn.

## 2013-12-30 NOTE — Assessment & Plan Note (Signed)
Unfortunately her improvement has plateaued and still dealing with a constant 2/10 pain at baseline despite extensive PT and home exercises, ASO.  At this point advised we should go ahead with MRI to assess for osteochondral defect which could require surgery and explain why she hasn't improved completely to this point.  Continue HEP, NSAIDs with hydrocodone prn, ASO.

## 2014-01-09 ENCOUNTER — Ambulatory Visit: Payer: Self-pay | Admitting: Family Medicine

## 2014-01-15 ENCOUNTER — Other Ambulatory Visit: Payer: Self-pay | Admitting: Family Medicine

## 2014-01-16 ENCOUNTER — Encounter: Payer: Self-pay | Admitting: Family Medicine

## 2014-01-16 ENCOUNTER — Encounter (INDEPENDENT_AMBULATORY_CARE_PROVIDER_SITE_OTHER): Payer: Self-pay

## 2014-01-16 ENCOUNTER — Ambulatory Visit (INDEPENDENT_AMBULATORY_CARE_PROVIDER_SITE_OTHER): Payer: Worker's Compensation | Admitting: Family Medicine

## 2014-01-16 VITALS — BP 131/85 | HR 88 | Ht 65.0 in | Wt 170.0 lb

## 2014-01-16 DIAGNOSIS — S99912D Unspecified injury of left ankle, subsequent encounter: Secondary | ICD-10-CM

## 2014-01-16 DIAGNOSIS — S39012D Strain of muscle, fascia and tendon of lower back, subsequent encounter: Secondary | ICD-10-CM

## 2014-01-16 DIAGNOSIS — M25572 Pain in left ankle and joints of left foot: Secondary | ICD-10-CM

## 2014-01-16 MED ORDER — NITROGLYCERIN 0.2 MG/HR TD PT24: MEDICATED_PATCH | TRANSDERMAL | Status: AC

## 2014-01-16 MED ORDER — HYDROCODONE-ACETAMINOPHEN 5-325 MG PO TABS: 1.0000 | ORAL_TABLET | Freq: Four times a day (QID) | ORAL | Status: AC | PRN

## 2014-01-16 NOTE — Patient Instructions (Signed)
We will go ahead with an MRI of your lumbar spine. It's ok to continue with therapy in the meantime. Can continue the diclofenac as well. 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 Jari Sportsmanscholls active series or the green arch supports). Nitro patches 1/4th patch to affected area, change daily. Follow up with me in 6 weeks (we will talk following the MRI though).

## 2014-01-24 ENCOUNTER — Encounter: Payer: Self-pay | Admitting: Family Medicine

## 2014-01-24 NOTE — Assessment & Plan Note (Signed)
MRI without any findings to warrant surgical intervention.  Continue therapy and home exercise program, ASO.  NSAIDs with hydrocodone prn.  Will add nitro patches for post tibialis tendinopathy though this is a mild component of her pain.  Arch supports for plantar fascia.  Can consider plantar fascia injection in future.  F/u in 6 weeks.

## 2014-01-24 NOTE — Assessment & Plan Note (Signed)
Possible disc herniation, radiculopathy with radiation into left foot.  Will go ahead with MRI to further assess.  Continue with physical therapy in the meantime.  Nsaids with hydrocodone prn.

## 2014-01-24 NOTE — Progress Notes (Signed)
Patient ID: Mary Meadows, female   DOB: 11/26/1960, 53 y.o.   MRN: [id number]  PCP: Joneen Roachhomas, GILLIAN W, NP  Subjective:   HPI: Patient is a 53 y.o. female here for left ankle injury, back injury.  6/18: Patient reports on 6/17 she was at work when she slipped on a piece of carpet padding, left ankle seemed to hyperdorsiflex and she fell backwards onto her buttocks. Went to ED - had x-rays negative for a fracture. Pain is lateral, radiates up outside of leg. Placed in a boot and taking hydrocodone as needed for pain. No prior ankle injuries.  7/2: Patient reports she is starting to improve.   Aching at times. Icing, takes norco as needed. Using [facility] powders if needed too. Using boot, doing HEP.  7/30: Patient reports she is about 50% better from last visit. Only started PT this week - first visit. Hematoma of foot is shrinking. Using ASO. Walking better. Taking pain medicine at night, ibuprofen during day.  8/31: Patient reports ankle is now 80-85% improved. Doing physical therapy for this. Takes hydrocodone if needed but more for her back. Had injury to this as well but has worsened over past few weeks. Mostly on left side with some radiation of numbness to left bottom of foot. No bowel/bladder dysfunction.  9/24: Patient's improvement has plateaued unfortunately. Still doing physical therapy - just had last visit of this. Doing home exercises too. Has hydrocodone as needed. Has not heard about physical therapy for her back yet - no changes here Radiates into left foot with numbness. No bowel/bladder dysfunction. Friday she felt like something pulled or popped on dorsal aspect of her left foot/ankle. Started using the brace again. Had swelling with this going into arch.  10/12: Patient returns as she's having a lot of trouble both with back and her foot/ankle. Ankle aches like a toothache with a burning sensation, some numbness. Wearing ankle brace regularly. Has  done several visits of therapy. Taking diclofenac and already s/p prednisone. MRI of left ankle showed only degenerative subchondral cyst in lateral cuneiform, unlikely related to this injury.  Also with some attenuation of posterior tibialis tendon but no tear or tenosynovitis. Low back still painful as well with radiation into left foot and numbness.   Past Medical History  Diagnosis Date  . Thyroid disease   . Depression   . Allergy     Current Outpatient Prescriptions on File Prior to Visit  Medication Sig Dispense Refill  . diclofenac (VOLTAREN) 75 MG EC tablet       . fluticasone (VERAMYST) 27.5 MCG/SPRAY nasal spray Place 2 sprays into the nose daily.      Marland Kitchen. gabapentin (NEURONTIN) 300 MG capsule       . levothyroxine (SYNTHROID, LEVOTHROID) 100 MCG tablet Take 100 mcg by mouth daily before breakfast.      . omeprazole (PRILOSEC) 40 MG capsule Take 40 mg by mouth daily.      . phentermine 37.5 MG capsule Take 37.5 mg by mouth every morning.       No current facility-administered medications on file prior to visit.    Past Surgical History  Procedure Laterality Date  . Lung surgery    . Thyroid surgery    . Abdominal hysterectomy      Allergies  Allergen Reactions  . Tramadol     History   Social History  . Marital Status: Married    Spouse Name: N/A    Number of Children: N/A  .  Years of Education: N/A   Occupational History  . Not on file.   Social History Main Topics  . Smoking status: Never Smoker   . Smokeless tobacco: Not on file  . Alcohol Use: Not on file  . Drug Use: Not on file  . Sexual Activity: Not on file   Other Topics Concern  . Not on file   Social History Narrative  . No narrative on file    No family history on file.  BP 131/85  Pulse 88  Ht 5\' 5"  (1.651 m)  Wt 170 lb (77.111 kg)  BMI 28.29 kg/m2  Review of Systems: See HPI above.    Objective:  Physical Exam:  Gen: NAD  Left ankle: No swelling, bruising, other  deformity. FROM with pain on dorsiflexion mildly. TTP greatest ATFL, proximal plantar fascia.  No other bony tenderness of ankle/foot.  Some tenderness posterior tibialis tendon. 1+ ant drawer and talar tilt.  Negative syndesmotic compression. Thompsons test negative. NV intact distally.  Back: No gross deformity, scoliosis. TTP left lumbar paraspinal region.  No midline or bony TTP. Negative SLRs. Sensation intact to light touch bilaterally. Negative logroll bilateral hips    Assessment & Plan:  1. Left ankle injury - MRI without any findings to warrant surgical intervention.  Continue therapy and home exercise program, ASO.  NSAIDs with hydrocodone prn.  Will add nitro patches for post tibialis tendinopathy though this is a mild component of her pain.  Arch supports for plantar fascia.  Can consider plantar fascia injection in future.  F/u in 6 weeks.  2. Low back pain - Possible disc herniation, radiculopathy with radiation into left foot.  Will go ahead with MRI to further assess.  Continue with physical therapy in the meantime.  Nsaids with hydrocodone prn.  Addendum:  MRI lumbar spine reviewed and discussed with patient.  She does have multiple levels of disc protrusions (from L1-2 through L5-S1) with mild left foraminal narrowing at L2-3 and subarticular stenosis at L4-5 (though foramina patent here).  Discussed her options and she would like to continue with physical therapy at this time.  Consider ESI at L2-3 or L4-5 if not improving.

## 2014-02-16 ENCOUNTER — Telehealth: Payer: Self-pay | Admitting: Family Medicine

## 2014-02-16 NOTE — Telephone Encounter (Signed)
Ok to go ahead with ESI at left L2-3.  Thanks!

## 2014-02-16 NOTE — Addendum Note (Signed)
Addended by: Kathi SimpersWISE, Cherry Turlington F on: 02/16/2014 11:51 AM   Modules accepted: Orders

## 2014-02-24 ENCOUNTER — Encounter: Payer: Self-pay | Admitting: Family Medicine

## 2014-02-24 ENCOUNTER — Ambulatory Visit (INDEPENDENT_AMBULATORY_CARE_PROVIDER_SITE_OTHER): Payer: Worker's Compensation | Admitting: Family Medicine

## 2014-02-24 VITALS — BP 135/82 | HR 65 | Ht 65.0 in | Wt 170.0 lb

## 2014-02-24 DIAGNOSIS — S99912D Unspecified injury of left ankle, subsequent encounter: Secondary | ICD-10-CM

## 2014-02-24 DIAGNOSIS — M5442 Lumbago with sciatica, left side: Secondary | ICD-10-CM

## 2014-02-24 MED ORDER — METHYLPREDNISOLONE ACETATE 40 MG/ML IJ SUSP
40.0000 mg | Freq: Once | INTRAMUSCULAR | Status: AC
  Administered 2014-02-24: 40 mg via INTRA_ARTICULAR

## 2014-02-24 NOTE — Patient Instructions (Signed)
Continue the nitro patches for another 6 weeks. You were given a posterior tibialis tendon sheath injection today. Stop physical therapy. I would recommend an epidural steroid injection for your back at this point. If these do not work you can consider a neurosurgery referral or nerve conduction studies.

## 2014-03-01 NOTE — Assessment & Plan Note (Signed)
MRI without any findings to warrant surgical intervention.  More consistent with posterior tibialis tendinopathy today but this would not explain all of her pain - some plantar fasciitis as well.  Continue therapy and home exercise program.  Continue nitro patches for 6 more weeks.  Arch supports for plantar fascia.  Posterior tibialis tendon sheath injection given today.  She is going to consult with foot/ankle surgery as well.    After informed written consent patient was seated on exam table.  Area overlying left posterior tibialis tendon posterior to medial malleolus prepped with alcohol swab then injected with 1:1 marcaine: depomedrol.  Patient tolerated procedure well without immediate complications.

## 2014-03-01 NOTE — Assessment & Plan Note (Signed)
Patient with mild left foraminal narrowing at L2-3 related to disc protrusions - this is most likely level of nerve irritation but typically would not go all the way down to foot.  Other level at L4-5 with subarticular stenosis though foramina appears patent.  She is going to see back surgeon first and consider ESI.

## 2014-03-01 NOTE — Progress Notes (Signed)
Patient ID: Mary Meadows, female   DOB: 1960-10-09, 53 y.o.   MRN: [id number]  PCP: Joneen Roachhomas, GILLIAN W, NP  Subjective:   HPI: Patient is a 53 y.o. female here for left ankle injury, back injury.  6/18: Patient reports on 6/17 she was at work when she slipped on a piece of carpet padding, left ankle seemed to hyperdorsiflex and she fell backwards onto her buttocks. Went to ED - had x-rays negative for a fracture. Pain is lateral, radiates up outside of leg. Placed in a boot and taking hydrocodone as needed for pain. No prior ankle injuries.  7/2: Patient reports she is starting to improve.   Aching at times. Icing, takes norco as needed. [facility] too. Using boot, doing HEP.  7/30: Patient reports she is about 50% better from last visit. Only started PT this week - first visit. Hematoma of foot is shrinking. Using ASO. Walking better. Taking pain medicine at night, ibuprofen during day.  8/31: Patient reports ankle is now 80-85% improved. Doing physical therapy for this. Takes hydrocodone if needed but more for her back. Had injury to this as well but has worsened over past few weeks. Mostly on left side with some radiation of numbness to left bottom of foot. No bowel/bladder dysfunction.  9/24: Patient's improvement has plateaued unfortunately. Still doing physical therapy - just had last visit of this. Doing home exercises too. Has hydrocodone as needed. Has not heard about physical therapy for her back yet - no changes here Radiates into left foot with numbness. No bowel/bladder dysfunction. Friday she felt like something pulled or popped on dorsal aspect of her left foot/ankle. Started using the brace again. Had swelling with this going into arch.  10/12: Patient returns as she's having a lot of trouble both with back and her foot/ankle. Ankle aches like a toothache with a burning sensation, some numbness. Wearing ankle brace regularly. Has  done several visits of therapy. Taking diclofenac and already s/p prednisone. MRI of left ankle showed only degenerative subchondral cyst in lateral cuneiform, unlikely related to this injury.  Also with some attenuation of posterior tibialis tendon but no tear or tenosynovitis. Low back still painful as well with radiation into left foot and numbness.   11/20: Patient returns feeling about the same compared to last visit. Back bothering her the most - 4/10 level.  Completed 31 physical therapy visits and still struggling. Radiates into left foot with numbness still. Discussed ESIs but case manager reports adjustor would like her to see a back surgeon for evaluation first then consider these. No bowel/bladder dysfunction. Ankle/foot pain 2/10 - feels like an ache mainly medial ankle. Trying to go without ASO now to see if that makes a difference. Using arch binder and sports insoles.  Past Medical History  Diagnosis Date  . Thyroid disease   . Depression   . Allergy     Current Outpatient Prescriptions on File Prior to Visit  Medication Sig Dispense Refill  . diclofenac (VOLTAREN) 75 MG EC tablet     . fluticasone (VERAMYST) 27.5 MCG/SPRAY nasal spray Place 2 sprays into the nose daily.    Marland Kitchen. gabapentin (NEURONTIN) 300 MG capsule     . HYDROcodone-acetaminophen (NORCO/VICODIN) 5-325 MG per tablet Take 1 tablet by mouth every 6 (six) hours as needed for moderate pain. 40 tablet 0  . nitroGLYCERIN (NITRODUR - DOSED IN MG/24 HR) 0.2 mg/hr patch Apply 1/4th affected patch to affected ankle, change daily 30 patch  1  . omeprazole (PRILOSEC) 40 MG capsule Take 40 mg by mouth daily.    . phentermine 37.5 MG capsule Take 37.5 mg by mouth every morning.     No current facility-administered medications on file prior to visit.    Past Surgical History  Procedure Laterality Date  . Lung surgery    . Thyroid surgery    . Abdominal hysterectomy      Allergies  Allergen Reactions  .  Tramadol     History   Social History  . Marital Status: Married    Spouse Name: N/A    Number of Children: N/A  . Years of Education: N/A   Occupational History  . Not on file.   Social History Main Topics  . Smoking status: Never Smoker   . Smokeless tobacco: Not on file  . Alcohol Use: Not on file  . Drug Use: Not on file  . Sexual Activity: Not on file   Other Topics Concern  . Not on file   Social History Narrative    No family history on file.  BP 135/82 mmHg  Pulse 65  Ht 5\' 5"  (1.651 m)  Wt 170 lb (77.111 kg)  BMI 28.29 kg/m2  Review of Systems: See HPI above.    Objective:  Physical Exam:  Gen: NAD  Left ankle: No swelling, bruising, other deformity. FROM with pain on dorsiflexion mildly. TTP mildly plantar fascia, ATFL.  Mod tenderness post tibialis tendon.   Trace ant drawer and talar tilt.  Negative syndesmotic compression. Thompsons test negative. NV intact distally.  Back: No gross deformity, scoliosis. TTP left lumbar paraspinal region.  No midline or bony TTP. FROM. Strength 5/5 bilateral lower extremities. Negative SLRs. Sensation intact to light touch bilaterally. Negative logroll bilateral hips    Assessment & Plan:  1. Left ankle injury - MRI without any findings to warrant surgical intervention.  More consistent with posterior tibialis tendinopathy today but this would not explain all of her pain - some plantar fasciitis as well.  Continue therapy and home exercise program.  Continue nitro patches for 6 more weeks.  Arch supports for plantar fascia.  Posterior tibialis tendon sheath injection given today.  She is going to consult with foot/ankle surgery as well.    After informed written consent patient was seated on exam table.  Area overlying left posterior tibialis tendon posterior to medial malleolus prepped with alcohol swab then injected with 1:1 marcaine: depomedrol.  Patient tolerated procedure well without immediate  complications.  2. Low back pain - Patient with mild left foraminal narrowing at L2-3 related to disc protrusions - this is most likely level of nerve irritation but typically would not go all the way down to foot.  Other level at L4-5 with subarticular stenosis though foramina appears patent.  She is going to see back surgeon first and consider ESI.

## 2016-06-16 IMAGING — CR DG KNEE COMPLETE 4+V*L*
4 series · 4 of 4 positions shown · non-contrast
Comparison: None.

CLINICAL DATA: Tripped over carpet.  Knee pain.

EXAM:
LEFT KNEE - COMPLETE 4+ VIEW

[t knee ap left]
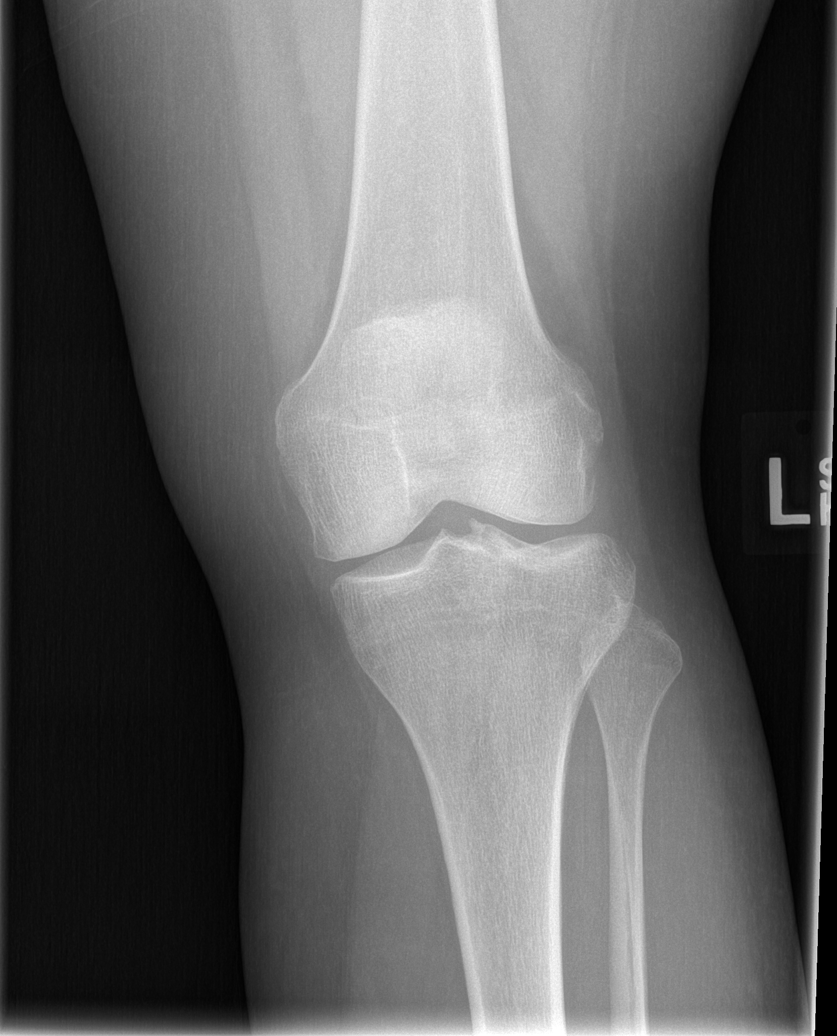

[t knee oblique left (1 of 2)]
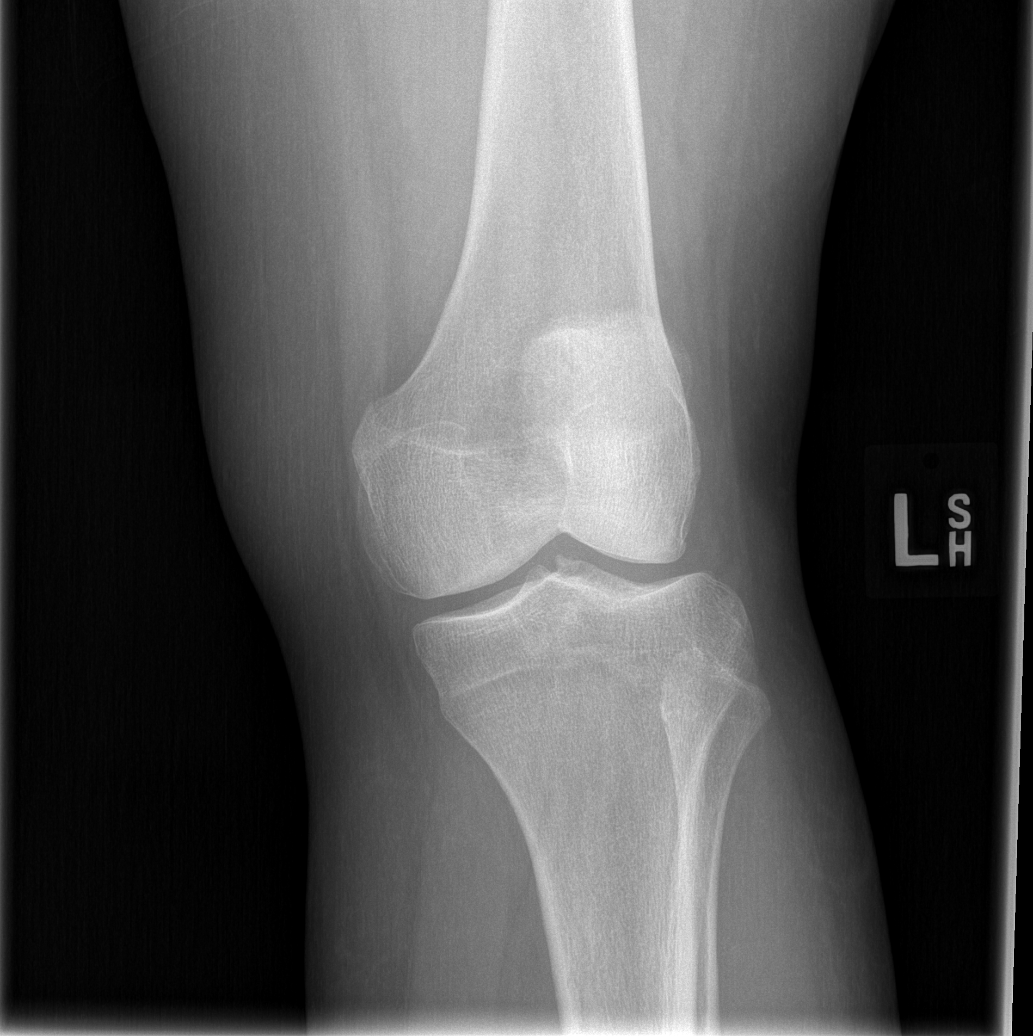

[t knee oblique left (2 of 2)]
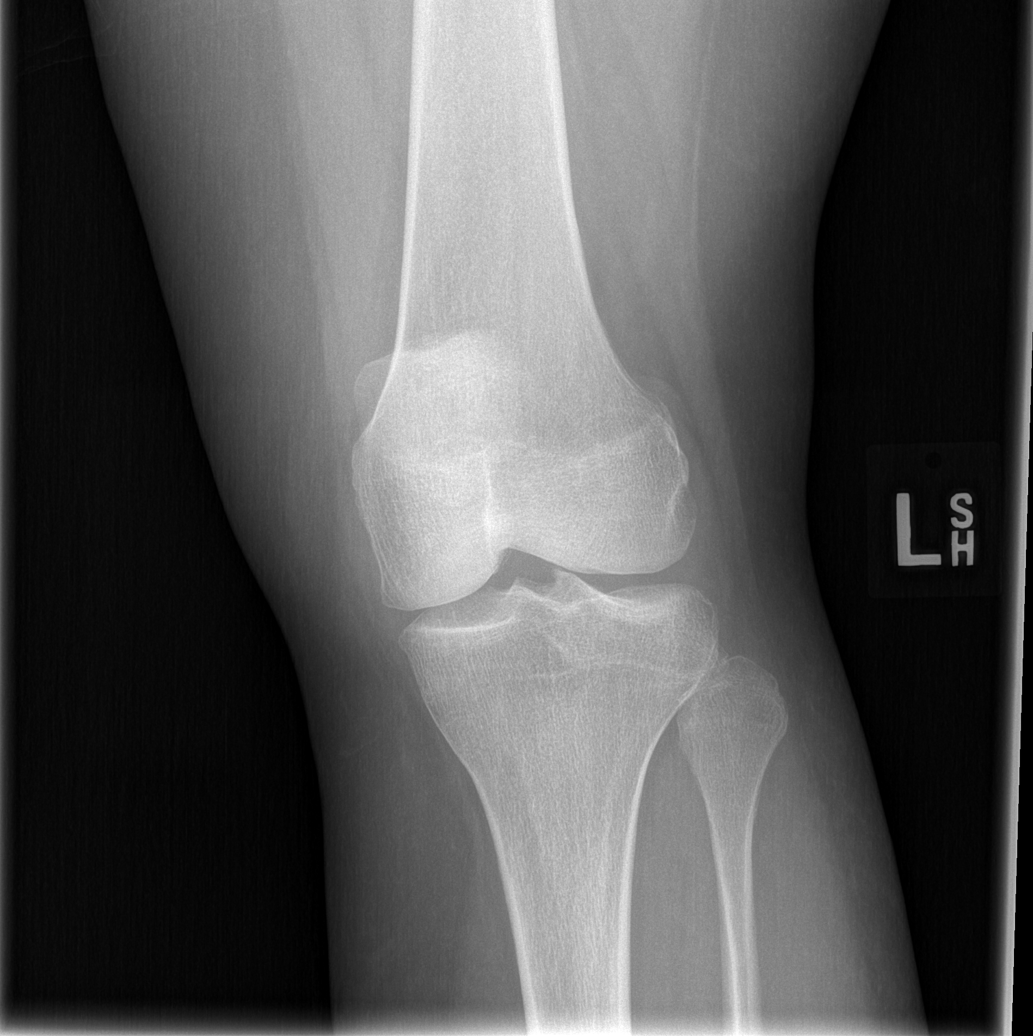

[t knee lat left]
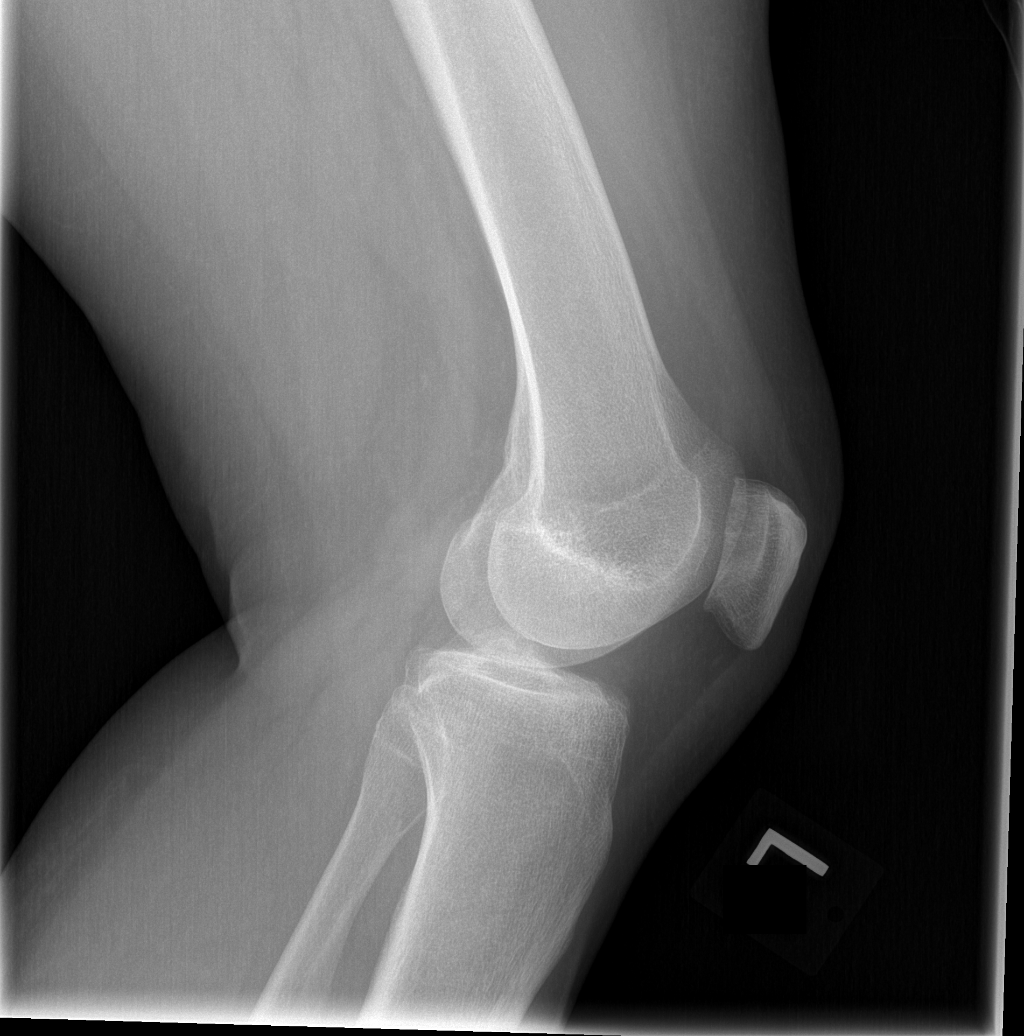

[4 of 4 positions shown; findings below may reference images not displayed]

FINDINGS: Mild spurring of the tibial spine. Mild patellar spurring. No
fracture or knee effusion. No acute bony findings.
IMPRESSION: 1. No acute bony findings or knee effusion.
2. Mild spurring of the patella and tibial spine.
